# Patient Record
Sex: Female | Born: 1995 | Race: White | Hispanic: No | Marital: Single | Smoking: Never smoker
Health system: Southern US, Community
[De-identification: ages and names within clinical notes are randomized; demographics above are authoritative.]

---
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
## 2016-04-30 ENCOUNTER — Emergency Department
Admission: EM | Admit: 2016-04-30 | Discharge: 2016-04-30 | Disposition: A | Discharge status: Home / Self Care | Payer: BC Managed Care – PPO | Source: Home / Self Care | Attending: Family Medicine | Admitting: Family Medicine

## 2016-04-30 ENCOUNTER — Encounter: Payer: Self-pay | Admitting: Emergency Medicine

## 2016-04-30 DIAGNOSIS — J069 Acute upper respiratory infection, unspecified: Secondary | ICD-10-CM | POA: Diagnosis not present

## 2016-04-30 MED ORDER — AZITHROMYCIN 250 MG PO TABS: 250.0000 mg | ORAL_TABLET | Freq: Every day | ORAL | 0 refills | Status: AC

## 2016-04-30 NOTE — ED Provider Notes (Signed)
CSN: [id number]     Arrival date & time 04/30/16  1100 History   First MD Initiated Contact with Patient 04/30/16 1154     Chief Complaint  Patient presents with  . Cough   (Consider location/radiation/quality/duration/timing/severity/associated sxs/prior Treatment) HPI  Alexa Mullins is a 20 y.o. female presenting to UC with mother c/o 1 week of gradually worsening cough with thick sputum and mild SOB this morning due to the thick phlegm.  She has tired OTC mucinex with mild relief. Cough keeps her up at night. Denies fever, chills, n/v/d. Denies chest pain or SOB at this time. No hx of asthma or reactive airway disease.    History reviewed. No pertinent past medical history. History reviewed. No pertinent surgical history. No family history on file. Social History  Substance Use Topics  . Smoking status: Never Smoker  . Smokeless tobacco: Never Used  . Alcohol use No   OB History    No data available     Review of Systems  Constitutional: Negative for chills and fever.  HENT: Positive for congestion, postnasal drip, rhinorrhea, sinus pressure and sore throat ( mild). Negative for ear pain (pressure), sinus pain, trouble swallowing and voice change.   Respiratory: Positive for cough and shortness of breath (mild).   Cardiovascular: Negative for chest pain and palpitations.  Gastrointestinal: Negative for abdominal pain, diarrhea, nausea and vomiting.  Musculoskeletal: Negative for arthralgias, back pain and myalgias.  Skin: Negative for rash.  Neurological: Negative for dizziness, light-headedness and headaches.    Allergies  Cabbage  Home Medications   Prior to Admission medications   Medication Sig Start Date End Date Taking? Authorizing Provider  azithromycin (ZITHROMAX) 250 MG tablet Take 1 tablet (250 mg total) by mouth daily. Take first 2 tablets together, then 1 every day until finished. 04/30/16   Junius FinnerErin O'Malley, PA-C   Meds Ordered and Administered this Visit   Medications - No data to display  BP 115/73 (BP Location: Left Arm)   Pulse 73   Temp 97.7 F (36.5 C) (Oral)   Ht 5\' 3"  (1.6 m)   Wt 129 lb (58.5 kg)   SpO2 98%   BMI 22.85 kg/m  No data found.   Physical Exam  Constitutional: She appears well-developed and well-nourished. No distress.  HENT:  Head: Normocephalic and atraumatic.  Right Ear: Tympanic membrane normal.  Left Ear: Tympanic membrane normal.  Nose: Mucosal edema present. Right sinus exhibits no maxillary sinus tenderness and no frontal sinus tenderness. Left sinus exhibits no maxillary sinus tenderness and no frontal sinus tenderness.  Mouth/Throat: Uvula is midline, oropharynx is clear and moist and mucous membranes are normal.  Eyes: Conjunctivae are normal. No scleral icterus.  Neck: Normal range of motion. Neck supple.  Cardiovascular: Normal rate, regular rhythm and normal heart sounds.   Pulmonary/Chest: Effort normal and breath sounds normal. No stridor. No respiratory distress. She has no wheezes. She has no rales.  Intermittent productive cough on exam. No wheeze. No respiratory distress.  Abdominal: Soft. She exhibits no distension. There is no tenderness.  Musculoskeletal: Normal range of motion.  Lymphadenopathy:    She has no cervical adenopathy.  Neurological: She is alert.  Skin: Skin is warm and dry. She is not diaphoretic.  Nursing note and vitals reviewed.   Urgent Care Course   Clinical Course     Procedures (including critical care time)  Labs Review Labs Reviewed - No data to display  Imaging Review No results found.   MDM  1. Upper respiratory tract infection, unspecified type    Pt c/o worsening URI symptoms over the last 1 week, cough keeps her up at night. Mild SOB this morning when she woke.    Rx: Azithromycin Encouraged fluids, rest, an mucinex.  F/u with PCP in 1 week if needed. Work note provided for this evening. Pt unsure if she wants to go tonight or not.     Junius Finnerrin O'Malley, PA-C 04/30/16 [phone]

## 2016-04-30 NOTE — ED Triage Notes (Signed)
Productive cough with yellow mucus x 1 week. Can't sleep.

## 2019-12-05 ENCOUNTER — Other Ambulatory Visit: Payer: Self-pay

## 2019-12-05 ENCOUNTER — Emergency Department (INDEPENDENT_AMBULATORY_CARE_PROVIDER_SITE_OTHER): Payer: BC Managed Care – PPO

## 2019-12-05 ENCOUNTER — Emergency Department
Admission: EM | Admit: 2019-12-05 | Discharge: 2019-12-05 | Disposition: A | Discharge status: Home / Self Care | Payer: BC Managed Care – PPO | Source: Ambulatory Visit

## 2019-12-05 VITALS — BP 117/77 | HR 60 | Temp 98.6°F | Resp 16

## 2019-12-05 DIAGNOSIS — S93602A Unspecified sprain of left foot, initial encounter: Secondary | ICD-10-CM | POA: Diagnosis not present

## 2019-12-05 DIAGNOSIS — M25572 Pain in left ankle and joints of left foot: Secondary | ICD-10-CM

## 2019-12-05 DIAGNOSIS — W108XXA Fall (on) (from) other stairs and steps, initial encounter: Secondary | ICD-10-CM

## 2019-12-05 DIAGNOSIS — M79672 Pain in left foot: Secondary | ICD-10-CM

## 2019-12-05 DIAGNOSIS — S93402A Sprain of unspecified ligament of left ankle, initial encounter: Secondary | ICD-10-CM

## 2019-12-05 NOTE — ED Provider Notes (Signed)
[facility]    CSN: [id number] Arrival date & time: 12/05/19  1417      History   Chief Complaint Chief Complaint  Patient presents with  . Appointment    2:00  . Ankle Injury    HPI Alexa Mullins is a 24 y.o. female.   HPI  Alexa Mullins is a 24 y.o. female presenting to UC with c/o Left ankle and foot pain with swelling and bruising that started immediately after she tripped missing the last step on a flight of stairs, twisting her foot on the way down. Pain is aching, 5/10, worse with walking, improved by wearing an ace bandage. She has also used ice and NSAIDs.    History reviewed. No pertinent past medical history.  There are no problems to display for this patient.   History reviewed. No pertinent surgical history.  OB History   No obstetric history on file.      Home Medications    Prior to Admission medications   Medication Sig Start Date End Date Taking? Authorizing Provider  azithromycin (ZITHROMAX) 250 MG tablet Take 1 tablet (250 mg total) by mouth daily. Take first 2 tablets together, then 1 every day until finished. 04/30/16   Lurene Shadow, PA-C    Family History Family History  Problem Relation Age of Onset  . Healthy Mother   . Diabetes Father     Social History Social History   Tobacco Use  . Smoking status: Never Smoker  . Smokeless tobacco: Never Used  Substance Use Topics  . Alcohol use: Yes    Comment: socially  . Drug use: Not on file     Allergies   Cabbage   Review of Systems Review of Systems  Musculoskeletal: Positive for arthralgias and joint swelling.  Skin: Positive for color change. Negative for wound.     Physical Exam Triage Vital Signs ED Triage Vitals  Enc Vitals Group     BP 12/05/19 1425 117/77     Pulse Rate 12/05/19 1425 60     Resp 12/05/19 1425 16     Temp 12/05/19 1425 98.6 F (37 C)     Temp Source 12/05/19 1425 Oral     SpO2 12/05/19 1425 100 %     Weight --      Height --       Head Circumference --      Peak Flow --      Pain Score 12/05/19 1422 5     Pain Loc --      Pain Edu? --      Excl. in GC? --    No data found.  Updated Vital Signs BP 117/77 (BP Location: Right Arm)   Pulse 60   Temp 98.6 F (37 C) (Oral)   Resp 16   LMP 12/04/2019 (Exact Date)   SpO2 100%   Visual Acuity Right Eye Distance:   Left Eye Distance:   Bilateral Distance:    Right Eye Near:   Left Eye Near:    Bilateral Near:     Physical Exam Vitals and nursing note reviewed.  Constitutional:      Appearance: Normal appearance. She is well-developed.  HENT:     Head: Normocephalic and atraumatic.  Cardiovascular:     Rate and Rhythm: Normal rate.     Pulses:          Dorsalis pedis pulses are 2+ on the left side.       Posterior tibial  pulses are 2+ on the left side.  Pulmonary:     Effort: Pulmonary effort is normal.  Musculoskeletal:        General: Swelling and tenderness present. Normal range of motion.     Cervical back: Normal range of motion.     Comments: Left ankle: moderated edema to lateral aspect, mild tenderness. Full ROM Left foot: mild to moderate edema to dorsal lateral proximal aspect. Tenderness over proximal 4th metatarsal.   Skin:    General: Skin is warm and dry.     Capillary Refill: Capillary refill takes less than 2 seconds.     Findings: Bruising present.  Neurological:     Mental Status: She is alert and oriented to person, place, and time.     Sensory: No sensory deficit.  Psychiatric:        Behavior: Behavior normal.      UC Treatments / Results  Labs (all labs ordered are listed, but only abnormal results are displayed) Labs Reviewed - No data to display  EKG   Radiology DG Ankle Complete Left  Result Date: 12/05/2019 CLINICAL DATA:  Acute left ankle pain after fall down stairs 2 days ago. EXAM: LEFT ANKLE COMPLETE - 3+ VIEW COMPARISON:  None. FINDINGS: There is no evidence of fracture, dislocation, or joint effusion.  There is no evidence of arthropathy or other focal bone abnormality. Mild soft tissue swelling is seen over lateral malleolus. IMPRESSION: No fracture or dislocation is noted. Mild soft tissue swelling is seen over lateral malleolus. Electronically Signed   By: Lupita Raider M.D.   On: 12/05/2019 14:51   DG Foot Complete Left  Result Date: 12/05/2019 CLINICAL DATA:  Acute left foot pain after fall 2 days ago. EXAM: LEFT FOOT - COMPLETE 3+ VIEW COMPARISON:  None. FINDINGS: There is no evidence of fracture or dislocation. There is no evidence of arthropathy or other focal bone abnormality. Soft tissues are unremarkable. IMPRESSION: Negative. Electronically Signed   By: Lupita Raider M.D.   On: 12/05/2019 14:53    Procedures Procedures (including critical care time)  Medications Ordered in UC Medications - No data to display  Initial Impression / Assessment and Plan / UC Course  I have reviewed the triage vital signs and the nursing notes.  Pertinent labs & imaging results that were available during my care of the patient were reviewed by me and considered in my medical decision making (see chart for details).    Reviewed imaging with pt Will tx as sprain Stirrup splint and crutches provided for comfort F/u with PCP or Sports Medicine in 1-2 weeks AVS given  Final Clinical Impressions(s) / UC Diagnoses   Final diagnoses:  Moderate left ankle sprain, initial encounter  Foot sprain, left, initial encounter     Discharge Instructions      You may take 500mg  acetaminophen every 4-6 hours or in combination with ibuprofen 400-600mg  every 6-8 hours as needed for pain and inflammation.  Call to schedule follow up with sports medicine in 1-2 weeks if needed.    ED Prescriptions    None     PDMP not reviewed this encounter.   , Lurene Shadow 12/05/19 1516

## 2019-12-05 NOTE — ED Triage Notes (Signed)
Patient presents to Urgent Care with complaints of left ankle/foot pain since two days ago. Patient reports she did not see the last step on a flight of stairs and fell down the last one, twisting her foot an awkward direction on the way down.

## 2019-12-05 NOTE — Discharge Instructions (Signed)
  You may take 500mg  acetaminophen every 4-6 hours or in combination with ibuprofen 400-600mg  every 6-8 hours as needed for pain and inflammation.  Call to schedule follow up with sports medicine in 1-2 weeks if needed.

## 2022-01-01 IMAGING — DX DG FOOT COMPLETE 3+V*L*
3 series · 3 of 3 positions shown · non-contrast
Comparison: None.

CLINICAL DATA: Acute left foot pain after fall 2 days ago.

EXAM:
LEFT FOOT - COMPLETE 3+ VIEW

[foot ap]
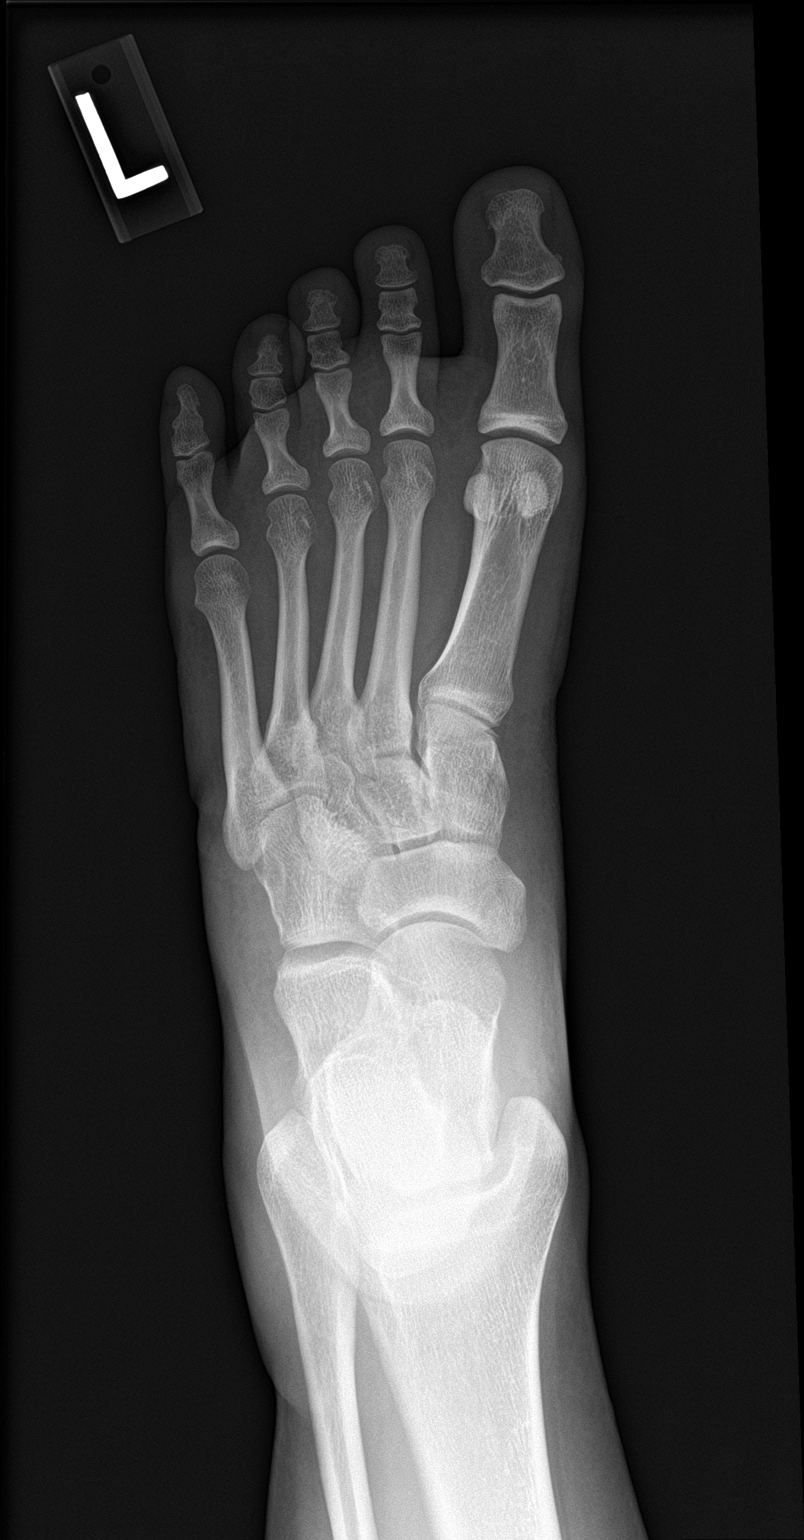

[foot obl]
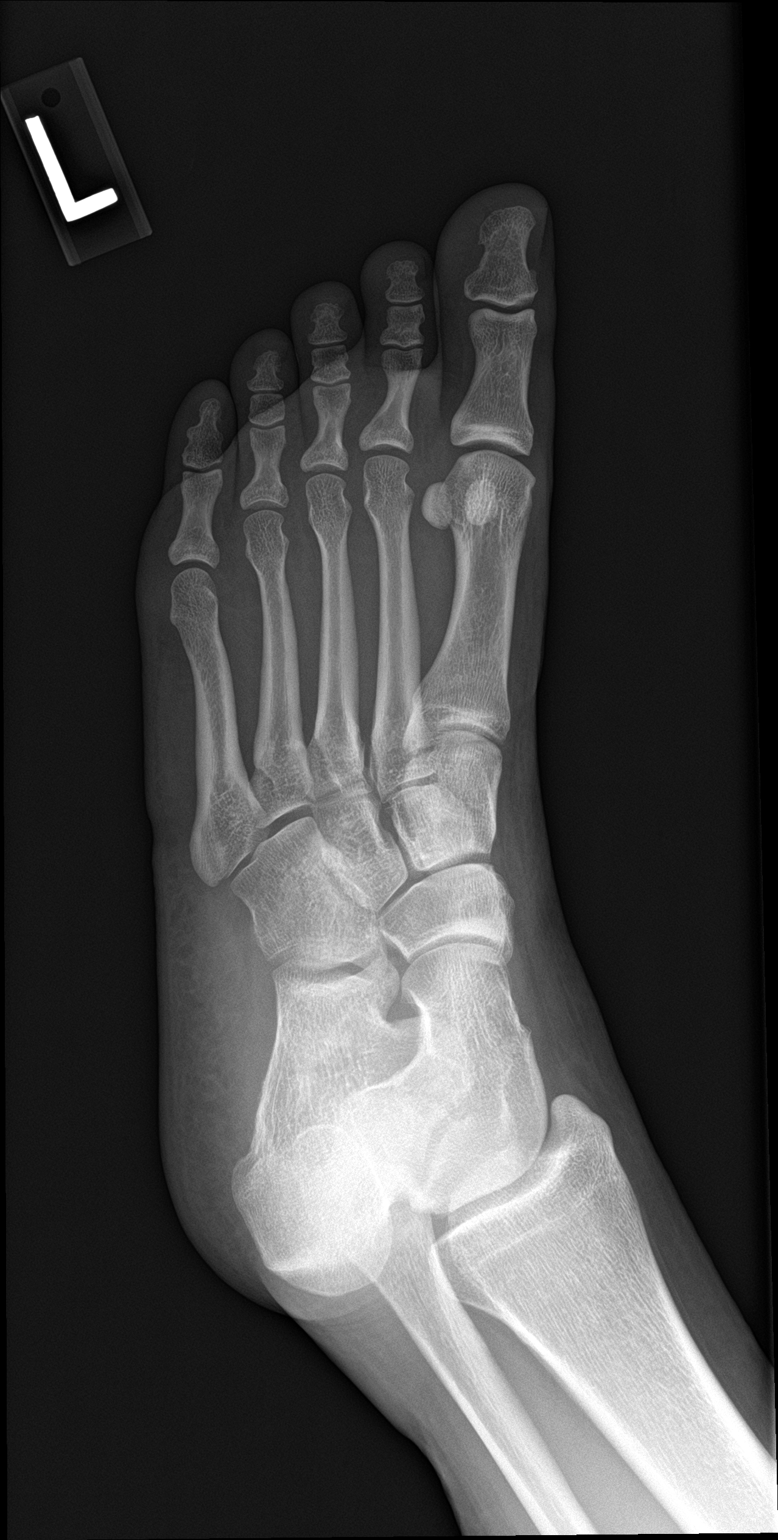

[foot lat]
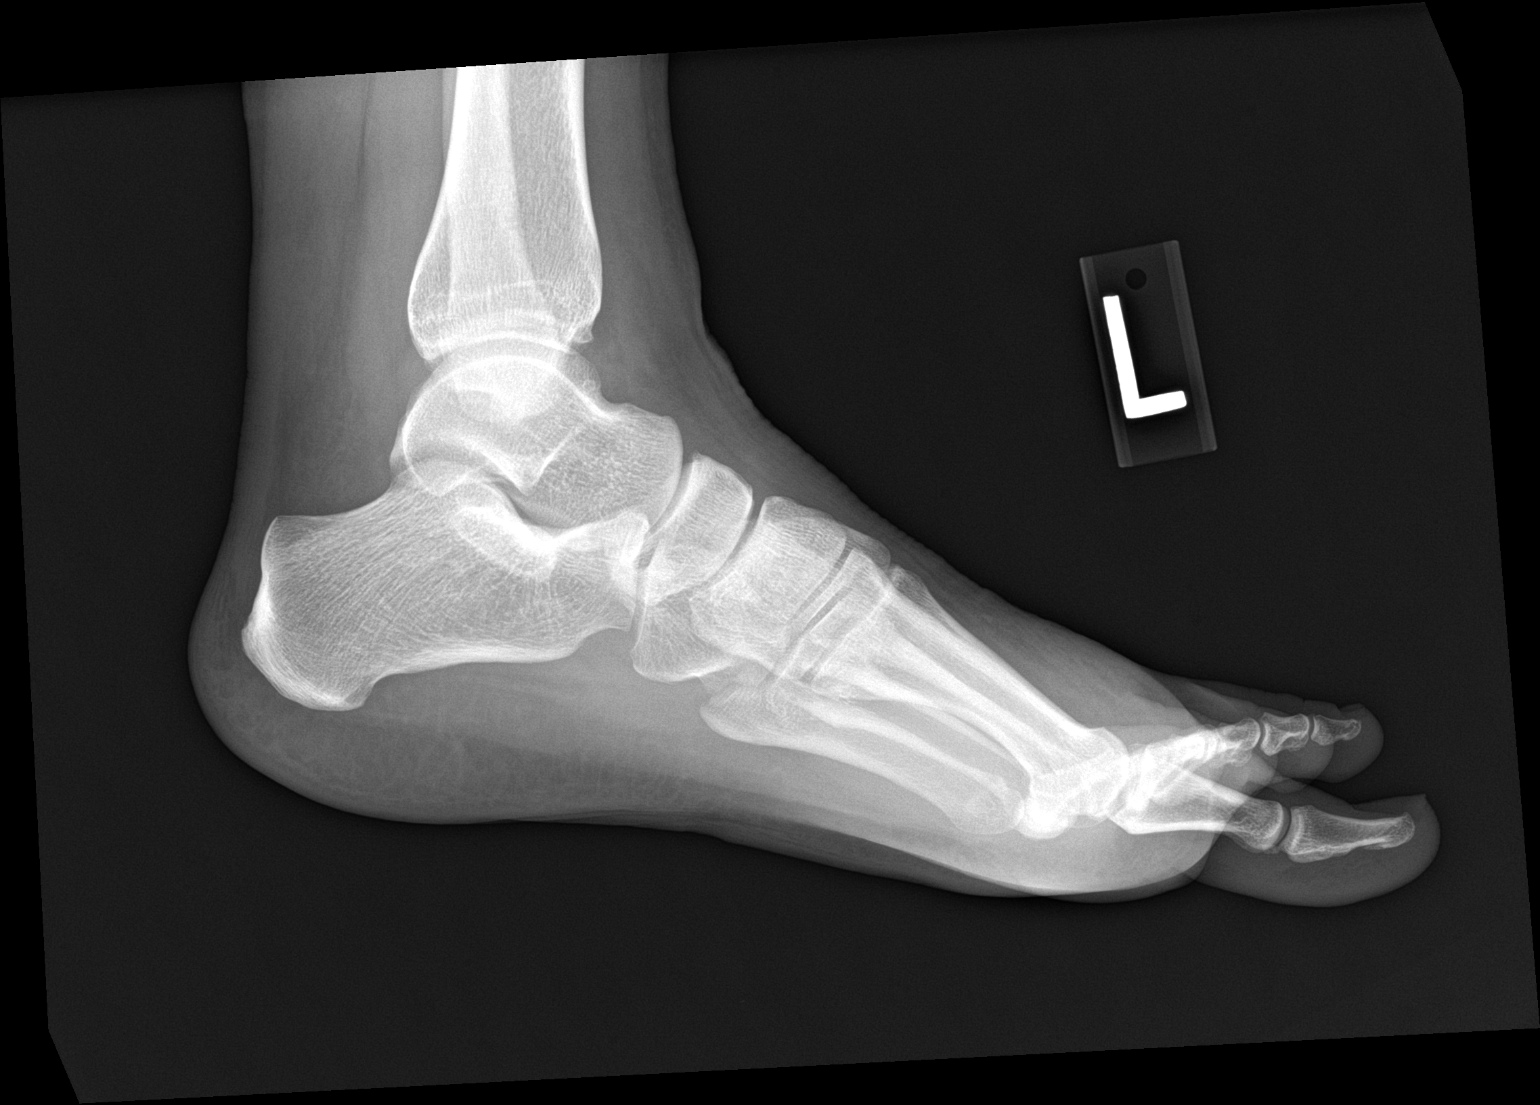

[3 of 3 positions shown; findings below may reference images not displayed]

FINDINGS: There is no evidence of fracture or dislocation. There is no
evidence of arthropathy or other focal bone abnormality. Soft
tissues are unremarkable.
IMPRESSION: Negative.
